# Patient Record
Sex: Female | Born: 1990 | Race: Black or African American | Hispanic: No | Marital: Single | Smoking: Current every day smoker
Health system: Southern US, Community
[De-identification: ages and names within clinical notes are randomized; demographics above are authoritative.]

---

## 2015-02-05 ENCOUNTER — Emergency Department (HOSPITAL_BASED_OUTPATIENT_CLINIC_OR_DEPARTMENT_OTHER)
Admission: EM | Admit: 2015-02-05 | Discharge: 2015-02-06 | Disposition: A | Discharge status: Home / Self Care | Payer: BLUE CROSS/BLUE SHIELD | Attending: Emergency Medicine | Admitting: Emergency Medicine

## 2015-02-05 ENCOUNTER — Encounter (HOSPITAL_BASED_OUTPATIENT_CLINIC_OR_DEPARTMENT_OTHER): Payer: Self-pay | Admitting: *Deleted

## 2015-02-05 ENCOUNTER — Emergency Department (HOSPITAL_BASED_OUTPATIENT_CLINIC_OR_DEPARTMENT_OTHER): Payer: Self-pay

## 2015-02-05 DIAGNOSIS — Z720 Tobacco use: Secondary | ICD-10-CM | POA: Insufficient documentation

## 2015-02-05 DIAGNOSIS — Y998 Other external cause status: Secondary | ICD-10-CM | POA: Insufficient documentation

## 2015-02-05 DIAGNOSIS — J069 Acute upper respiratory infection, unspecified: Secondary | ICD-10-CM

## 2015-02-05 DIAGNOSIS — Y9367 Activity, basketball: Secondary | ICD-10-CM | POA: Insufficient documentation

## 2015-02-05 DIAGNOSIS — W500XXA Accidental hit or strike by another person, initial encounter: Secondary | ICD-10-CM | POA: Insufficient documentation

## 2015-02-05 DIAGNOSIS — S63501A Unspecified sprain of right wrist, initial encounter: Secondary | ICD-10-CM

## 2015-02-05 DIAGNOSIS — Y92310 Basketball court as the place of occurrence of the external cause: Secondary | ICD-10-CM | POA: Insufficient documentation

## 2015-02-05 NOTE — ED Notes (Signed)
A week ago she was playing basketball and had pain afterward. No known injury. Pain continues. Cough for a month.

## 2015-02-06 MED ORDER — GUAIFENESIN 100 MG/5ML PO LIQD: 100.0000 mg | ORAL | Status: AC | PRN

## 2015-02-06 NOTE — ED Provider Notes (Signed)
CSN: [id number]     Arrival date & time 02/05/15  2131 History   First MD Initiated Contact with Patient 02/05/15 2359     Chief Complaint  Patient presents with  . URI     (Consider location/radiation/quality/duration/timing/severity/associated sxs/prior Treatment) HPI Comments: Patient presents to the emergency department with chief complaints of cough, URI symptoms times several weeks. She denies any fevers chills. Denies nausea, or vomiting. States that she has mainly just had a dry nonproductive cough. She has not tried taking anything to alleviate her symptoms. There are no aggravating factors. Additionally, patient complains of right wrist pain. She states that she hurt her wrist while playing basketball the other day. States that someone pulled on it hard. It is painful with palpation with range of motion. She has not taken anything for her symptoms.  The history is provided by the patient. No language interpreter was used.    History reviewed. No pertinent past medical history. History reviewed. No pertinent past surgical history. No family history on file. Social History  Substance Use Topics  . Smoking status: Current Every Day Smoker    Types: Cigarettes  . Smokeless tobacco: None  . Alcohol Use: Yes     Comment: once a month   OB History    No data available     Review of Systems  Constitutional: Negative for fever and chills.  HENT: Positive for postnasal drip, rhinorrhea, sinus pressure and sneezing. Negative for sore throat.   Respiratory: Positive for cough. Negative for shortness of breath.   Cardiovascular: Negative for chest pain.  Gastrointestinal: Negative for nausea, vomiting, abdominal pain, diarrhea and constipation.  Genitourinary: Negative for dysuria.  Musculoskeletal: Positive for arthralgias.      Allergies  Review of patient's allergies indicates no known allergies.  Home Medications   Prior to Admission medications   Not on File   BP  124/76 mmHg  Pulse 73  Temp(Src) 97.9 F (36.6 C) (Oral)  Resp 18  Ht  (1.854 m)  Wt 240 lb (108.863 kg)  BMI 31.67 kg/m2  SpO2 99%  LMP 01/26/2015 Physical Exam  Constitutional: She is oriented to person, place, and time. She appears well-developed and well-nourished. No distress.  HENT:  Head: Normocephalic and atraumatic.  Right Ear: External ear normal.  Left Ear: External ear normal.  Mildly erythematous, no tonsillar exudate, no abscess, no stridor, uvula is midline    Eyes: Conjunctivae and EOM are normal. Pupils are equal, round, and reactive to light.  Neck: Normal range of motion. Neck supple.  Cardiovascular: Normal rate, regular rhythm and normal heart sounds.  Exam reveals no gallop and no friction rub.   No murmur heard. Pulmonary/Chest: Effort normal and breath sounds normal. No stridor. No respiratory distress. She has no wheezes. She has no rales. She exhibits no tenderness.  CTAB  Abdominal: Soft. Bowel sounds are normal. She exhibits no distension and no mass. There is no tenderness. There is no rebound and no guarding.  Musculoskeletal: Normal range of motion. She exhibits no edema or tenderness.  Right wrist mildly tender to palpation, no bony abnormality or deformity, range of motion strength limited secondary to pain, no pain at the anatomical snuffbox.  Neurological: She is alert and oriented to person, place, and time.  Skin: Skin is warm and dry. No rash noted. She is not diaphoretic.  Psychiatric: She has a normal mood and affect. Her behavior is normal. Judgment and thought content normal.  Nursing note and vitals  reviewed.   ED Course  Procedures (including critical care time) Labs Review Labs Reviewed - No data to display  Imaging Review Dg Chest 2 View  02/05/2015   CLINICAL DATA:  Cough for 1 month.  EXAM: CHEST  2 VIEW  COMPARISON:  None.  FINDINGS: The cardiomediastinal contours are normal. The lungs are clear. Pulmonary vasculature is  normal. No consolidation, pleural effusion, or pneumothorax. No acute osseous abnormalities are seen.  IMPRESSION: No acute pulmonary process.   Electronically Signed   By: Rubye Oaks M.D.   On: 02/05/2015 22:48   Dg Wrist Complete Right  02/05/2015   CLINICAL DATA:  24 year old female with wrist pain  EXAM: RIGHT WRIST - COMPLETE 3+ VIEW  COMPARISON:  None.  FINDINGS: No acute fracture or dislocation. 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 The soft tissues are unremarkable.  IMPRESSION: No acute fracture or dislocation.  Lunotriquetral coalition.   Electronically Signed   By: Elgie Collard M.D.   On: 02/05/2015 22:48   I have personally reviewed and evaluated these images and lab results as part of my medical decision-making.   MDM   Final diagnoses:  URI (upper respiratory infection)  Wrist sprain, right, initial encounter    Pt CXR negative for acute infiltrate. Patients symptoms are consistent with URI, likely viral etiology. Discussed that antibiotics are not indicated for viral infections. Pt will be discharged with symptomatic treatment.  Verbalizes understanding and is agreeable with plan. Pt is hemodynamically stable & in NAD prior to dc.     Roxy Horseman, PA-C 02/06/15 0020  Paula Libra, MD 02/06/15 [phone]

## 2015-02-06 NOTE — ED Notes (Signed)
PA at bedside.

## 2015-02-06 NOTE — Discharge Instructions (Signed)
Cough, Adult  A cough is a reflex that helps clear your throat and airways. It can help heal the body or may be a reaction to an irritated airway. A cough may only last 2 or 3 weeks (acute) or may last more than 8 weeks (chronic).  CAUSES Acute cough:  Viral or bacterial infections. Chronic cough:  Infections.  Allergies.  Asthma.  Post-nasal drip.  Smoking.  Heartburn or acid reflux.  Some medicines.  Chronic lung problems (COPD).  Cancer. SYMPTOMS   Cough.  Fever.  Chest pain.  Increased breathing rate.  High-pitched whistling sound when breathing (wheezing).  Colored mucus that you cough up (sputum). TREATMENT   A bacterial cough may be treated with antibiotic medicine.  A viral cough must run its course and will not respond to antibiotics.  Your caregiver may recommend other treatments if you have a chronic cough. HOME CARE INSTRUCTIONS   Only take over-the-counter or prescription medicines for pain, discomfort, or fever as directed by your caregiver. Use cough suppressants only as directed by your caregiver.  Use a cold steam vaporizer or humidifier in your bedroom or home to help loosen secretions.  Sleep in a semi-upright position if your cough is worse at night.  Rest as needed.  Stop smoking if you smoke. SEEK IMMEDIATE MEDICAL CARE IF:   You have pus in your sputum.  Your cough starts to worsen.  You cannot control your cough with suppressants and are losing sleep.  You begin coughing up blood.  You have difficulty breathing.  You develop pain which is getting worse or is uncontrolled with medicine.  You have a fever. MAKE SURE YOU:   Understand these instructions.  Will watch your condition.  Will get help right away if you are not doing well or get worse. Document Released: 11/08/2010 Document Revised: 08/04/2011 Document Reviewed: 11/08/2010 [facility] Patient Information 2015 [place], [place]. This information is not intended  to replace advice given to you by your health care provider. Make sure you discuss any questions you have with your health care provider. Wrist Pain Wrist injuries are frequent in adults and children. A sprain is an injury to the ligaments that hold your bones together. A strain is an injury to muscle or muscle cord-like structures (tendons) from stretching or pulling. Generally, when wrists are moderately tender to touch following a fall or injury, a break in the bone (fracture) may be present. Most wrist sprains or strains are better in 3 to 5 days, but complete healing may take several weeks. HOME CARE INSTRUCTIONS   Put ice on the injured area.  Put ice in a plastic bag.  Place a towel between your skin and the bag.  Leave the ice on for 15-20 minutes, 3-4 times a day, for the first 2 days, or as directed by your health care provider.  Keep your arm raised above the level of your heart whenever possible to reduce swelling and pain.  Rest the injured area for at least 48 hours or as directed by your health care provider.  If a splint or elastic bandage has been applied, use it for as long as directed by your health care provider or until seen by a health care provider for a follow-up exam.  Only take over-the-counter or prescription medicines for pain, discomfort, or fever as directed by your health care provider.  Keep all follow-up appointments. You may need to follow up with a specialist or have follow-up X-rays. Improvement in pain level is  not a guarantee that you did not fracture a bone in your wrist. The only way to determine whether or not you have a broken bone is by X-ray. SEEK IMMEDIATE MEDICAL CARE IF:   Your fingers are swollen, very red, white, or cold and blue.  Your fingers are numb or tingling.  You have increasing pain.  You have difficulty moving your fingers. MAKE SURE YOU:   Understand these instructions.  Will watch your condition.  Will get help right  away if you are not doing well or get worse. Document Released: 02/19/2005 Document Revised: 05/17/2013 Document Reviewed: 07/03/2010 [facility] Patient Information 2015 [place], [place]. This information is not intended to replace advice given to you by your health care provider. Make sure you discuss any questions you have with your health care provider.
